# Patient Record
Sex: Male | Born: 1935 | Race: White | Hispanic: No | Marital: Single | Smoking: Current every day smoker
Health system: Southern US, Community
[De-identification: ages and names within clinical notes are randomized; demographics above are authoritative.]

## PROBLEM LIST (undated history)

## (undated) DIAGNOSIS — G20 Parkinson's disease: Secondary | ICD-10-CM

## (undated) DIAGNOSIS — F1010 Alcohol abuse, uncomplicated: Secondary | ICD-10-CM

## (undated) DIAGNOSIS — F319 Bipolar disorder, unspecified: Secondary | ICD-10-CM

## (undated) DIAGNOSIS — J449 Chronic obstructive pulmonary disease, unspecified: Secondary | ICD-10-CM

## (undated) DIAGNOSIS — G20A1 Parkinson's disease without dyskinesia, without mention of fluctuations: Secondary | ICD-10-CM

---

## 2015-05-18 ENCOUNTER — Encounter: Payer: Self-pay | Admitting: Emergency Medicine

## 2015-05-18 ENCOUNTER — Emergency Department: Payer: MEDICARE

## 2015-05-18 ENCOUNTER — Emergency Department
Admission: EM | Admit: 2015-05-18 | Discharge: 2015-05-18 | Disposition: A | Discharge status: Home / Self Care | Payer: MEDICARE | Attending: Emergency Medicine | Admitting: Emergency Medicine

## 2015-05-18 DIAGNOSIS — J441 Chronic obstructive pulmonary disease with (acute) exacerbation: Secondary | ICD-10-CM | POA: Insufficient documentation

## 2015-05-18 DIAGNOSIS — R0602 Shortness of breath: Secondary | ICD-10-CM | POA: Diagnosis present

## 2015-05-18 DIAGNOSIS — Z720 Tobacco use: Secondary | ICD-10-CM | POA: Insufficient documentation

## 2015-05-18 DIAGNOSIS — Z79899 Other long term (current) drug therapy: Secondary | ICD-10-CM | POA: Diagnosis not present

## 2015-05-18 HISTORY — DX: Parkinson's disease without dyskinesia, without mention of fluctuations: G20.A1

## 2015-05-18 HISTORY — DX: Chronic obstructive pulmonary disease, unspecified: J44.9

## 2015-05-18 HISTORY — DX: Parkinson's disease: G20

## 2015-05-18 HISTORY — DX: Alcohol abuse, uncomplicated: F10.10

## 2015-05-18 HISTORY — DX: Bipolar disorder, unspecified: F31.9

## 2015-05-18 LAB — CBC
HCT: 35.4 % — ABNORMAL LOW (ref 40.0–52.0)
Hemoglobin: 12 g/dL — ABNORMAL LOW (ref 13.0–18.0)
MCH: 30.2 pg (ref 26.0–34.0)
MCHC: 33.8 g/dL (ref 32.0–36.0)
MCV: 89.3 fL (ref 80.0–100.0)
PLATELETS: 187 10*3/uL (ref 150–440)
RBC: 3.96 MIL/uL — ABNORMAL LOW (ref 4.40–5.90)
RDW: 15.1 % — ABNORMAL HIGH (ref 11.5–14.5)
WBC: 7.2 10*3/uL (ref 3.8–10.6)

## 2015-05-18 LAB — COMPREHENSIVE METABOLIC PANEL
ALT: 7 U/L — AB (ref 17–63)
ANION GAP: 9 (ref 5–15)
AST: 16 U/L (ref 15–41)
Albumin: 3.3 g/dL — ABNORMAL LOW (ref 3.5–5.0)
Alkaline Phosphatase: 57 U/L (ref 38–126)
BUN: 11 mg/dL (ref 6–20)
CALCIUM: 8.4 mg/dL — AB (ref 8.9–10.3)
CHLORIDE: 106 mmol/L (ref 101–111)
CO2: 25 mmol/L (ref 22–32)
Creatinine, Ser: 0.8 mg/dL (ref 0.61–1.24)
GFR calc Af Amer: >60 mL/min (ref >60)
GFR calc non Af Amer: >60 mL/min (ref >60)
Glucose, Bld: 90 mg/dL (ref 65–99)
POTASSIUM: 2.9 mmol/L — AB (ref 3.5–5.1)
Sodium: 140 mmol/L (ref 135–145)
TOTAL PROTEIN: 6 g/dL — AB (ref 6.5–8.1)
Total Bilirubin: 0.5 mg/dL (ref 0.3–1.2)

## 2015-05-18 LAB — TROPONIN I

## 2015-05-18 MED ORDER — IPRATROPIUM-ALBUTEROL 0.5-2.5 (3) MG/3ML IN SOLN
3.0000 mL | Freq: Once | RESPIRATORY_TRACT | Status: AC
  Administered 2015-05-18: 3 mL via RESPIRATORY_TRACT
  Filled 2015-05-18: qty 3

## 2015-05-18 MED ORDER — METHYLPREDNISOLONE SODIUM SUCC 125 MG IJ SOLR
125.0000 mg | Freq: Once | INTRAMUSCULAR | Status: AC
  Administered 2015-05-18: 125 mg via INTRAVENOUS
  Filled 2015-05-18: qty 2

## 2015-05-18 MED ORDER — ALBUTEROL SULFATE (2.5 MG/3ML) 0.083% IN NEBU
2.5000 mg | INHALATION_SOLUTION | Freq: Once | RESPIRATORY_TRACT | Status: AC
  Administered 2015-05-18: 2.5 mg via RESPIRATORY_TRACT
  Filled 2015-05-18: qty 3

## 2015-05-18 MED ORDER — PREDNISONE 50 MG PO TABS: 50.0000 mg | ORAL_TABLET | Freq: Every day | ORAL | Status: AC

## 2015-05-18 MED ORDER — ALBUTEROL SULFATE HFA 108 (90 BASE) MCG/ACT IN AERS
2.0000 | INHALATION_SPRAY | Freq: Four times a day (QID) | RESPIRATORY_TRACT | Status: AC | PRN
Start: 2015-05-18 — End: ?

## 2015-05-18 MED ORDER — POTASSIUM CHLORIDE CRYS ER 20 MEQ PO TBCR
20.0000 meq | EXTENDED_RELEASE_TABLET | Freq: Once | ORAL | Status: AC
  Administered 2015-05-18: 20 meq via ORAL
  Filled 2015-05-18: qty 1

## 2015-05-18 NOTE — ED Notes (Signed)
PT with d/c orders.  No family present, pt unable to give name or phone number of contact person.  Will attempt to find contact information for family to discharge pt.

## 2015-05-18 NOTE — Discharge Instructions (Signed)

## 2015-05-18 NOTE — ED Provider Notes (Signed)
[facility] Emergency Department Provider Note  ____________________________________________  Time seen: On arrival, via EMS  I have reviewed the triage vital signs and the nursing notes.   HISTORY  Chief Complaint Shortness of Breath  History is significantly limited as patient has difficulty providing answers  HPI Adrian Fisher is a 81 y.o. male who presents with reported shortness of breath and feeling "sick". Again history is somewhat limited the patient reports he feels "wiped out". He denies chest pain. He apparently has a history of bipolar disorder and Parkinson's disease as well as alcohol abuse and COPD. He reports he continues to smoke. He does use oxygen at home as needed     Past Medical History  Diagnosis Date  . COPD (chronic obstructive pulmonary disease)   . ETOH abuse   . Bipolar 1 disorder   . Parkinson disease     There are no active problems to display for this patient.   History reviewed. No pertinent past surgical history.  Current Outpatient Rx  Name  Route  Sig  Dispense  Refill  . carbidopa-levodopa-entacapone (STALEVO) 50-200-200 MG per tablet   Oral   Take 1 tablet by mouth 3 (three) times daily.         Marland Kitchen donepezil (ARICEPT) 5 MG tablet   Oral   Take 5 mg by mouth at bedtime.         . pantoprazole (PROTONIX) 40 MG tablet   Oral   Take 40 mg by mouth daily.         . pramipexole (MIRAPEX) 0.125 MG tablet   Oral   Take 0.125 mg by mouth 3 (three) times daily.           Allergies Review of patient's allergies indicates no known allergies.  History reviewed. No pertinent family history.  Social History History  Substance Use Topics  . Smoking status: Current Every Day Smoker  . Smokeless tobacco: Not on file  . Alcohol Use: Yes    Review of Systems  Constitutional: Negative for fever. Eyes: Patient reports very poor eyesight and states he is going to have cataract surgery ENT:  Negative for sore throat Cardiovascular: Negative for chest pain. Respiratory: Positive for shortness of breath Gastrointestinal: Negative for abdominal pain, vomiting and diarrhea. Genitourinary: Negative for dysuria. Musculoskeletal: Negative for back pain. Skin: Negative for rash. Neurological: Negative for headaches or focal weakness Psychiatric: No anxiety    ____________________________________________   PHYSICAL EXAM:  VITAL SIGNS: ED Triage Vitals  Enc Vitals Group     BP 05/18/15 1327 139/91 mmHg     Pulse Rate 05/18/15 1327 71     Resp 05/18/15 1327 18     Temp 05/18/15 1327 97.6 F (36.4 C)     Temp src --      SpO2 05/18/15 1327 97 %     Weight 05/18/15 1327 140 lb (63.504 kg)     Height 05/18/15 1327 6\' 2"  (1.88 m)     Head Cir --      Peak Flow --      Pain Score 05/18/15 1328 0     Pain Loc --      Pain Edu? --      Excl. in GC? --      Constitutional: Patient answers questions when asked although when he arrived eyes were closed Eyes: Conjunctivae are normal.  ENT   Head: Normocephalic and atraumatic.   Mouth/Throat: Mucous membranes are moist. Cardiovascular: Normal rate, regular rhythm.  Normal and symmetric distal pulses are present in all extremities. No murmurs, rubs, or gallops. Respiratory: Normal respiratory effort without tachypnea nor retractions. Breath sounds with scattered wheezes bilaterally Gastrointestinal: Soft and non-tender in all quadrants. No distention. There is no CVA tenderness. Genitourinary: deferred Musculoskeletal: Nontender with normal range of motion in all extremities. No lower extremity tenderness nor edema. Neurologic:  Normal speech and language. No gross focal neurologic deficits are appreciated. Skin:  Skin is warm, dry and intact. No rash noted. Psychiatric: Mood and affect are normal.  ____________________________________________    LABS (pertinent positives/negatives)  Labs Reviewed  CBC - Abnormal;  Notable for the following:    RBC 3.96 (*)    Hemoglobin 12.0 (*)    HCT 35.4 (*)    RDW 15.1 (*)    All other components within normal limits  COMPREHENSIVE METABOLIC PANEL  TROPONIN I    ____________________________________________   EKG  None  ____________________________________________    RADIOLOGY I have personally reviewed any xrays that were ordered on this patient: X-ray show changes of COPD but otherwise unremarkable  ____________________________________________   PROCEDURES  Procedure(s) performed: none  Critical Care performed: none  ____________________________________________   INITIAL IMPRESSION / ASSESSMENT AND PLAN / ED COURSE  Pertinent labs & imaging results that were available during my care of the patient were reviewed by me and considered in my medical decision making (see chart for details).  Patient's exam is consistent with COPD exacerbation. He denies chest pain. We will treat with Solu-Medrol and nebulizers and reevaluate.   ----------------------------------------- 3:20 PM on 05/18/2015 -----------------------------------------  Chest x-ray unremarkable. Patient has been treated with steroid's and nebs and his breathing seems to be much improved. I do not see an indication to admit him to the hospital at this time even vitals that are essentially normal and significant improvement in his breathing. He states that he will follow-up with his eye doctor and his PCP. He knows he can return at any time ____________________________________________   FINAL CLINICAL IMPRESSION(S) / ED DIAGNOSES  Final diagnoses:  COPD exacerbation     Jene Every, MD 05/18/15 [phone]

## 2015-05-18 NOTE — ED Notes (Signed)
Pt to ER via EMS from home.  Reports of male in house that he has been short of breath since last night.  Pt alert, but slow to answer questions on arrival.  NAD noted at this time, Dr. Cyril Loosen to bedside to assess pt.

## 2015-05-27 ENCOUNTER — Emergency Department: Payer: MEDICARE

## 2015-05-27 ENCOUNTER — Other Ambulatory Visit: Payer: Self-pay

## 2015-05-27 ENCOUNTER — Emergency Department
Admission: EM | Admit: 2015-05-27 | Discharge: 2015-05-27 | Disposition: A | Discharge status: Home / Self Care | Payer: MEDICARE | Attending: Emergency Medicine | Admitting: Emergency Medicine

## 2015-05-27 ENCOUNTER — Encounter: Payer: Self-pay | Admitting: Emergency Medicine

## 2015-05-27 DIAGNOSIS — F0280 Dementia in other diseases classified elsewhere without behavioral disturbance: Secondary | ICD-10-CM | POA: Diagnosis not present

## 2015-05-27 DIAGNOSIS — Z79811 Long term (current) use of aromatase inhibitors: Secondary | ICD-10-CM | POA: Diagnosis not present

## 2015-05-27 DIAGNOSIS — G3183 Dementia with Lewy bodies: Secondary | ICD-10-CM | POA: Diagnosis not present

## 2015-05-27 DIAGNOSIS — Z791 Long term (current) use of non-steroidal anti-inflammatories (NSAID): Secondary | ICD-10-CM | POA: Insufficient documentation

## 2015-05-27 DIAGNOSIS — Z79899 Other long term (current) drug therapy: Secondary | ICD-10-CM | POA: Insufficient documentation

## 2015-05-27 DIAGNOSIS — J471 Bronchiectasis with (acute) exacerbation: Secondary | ICD-10-CM

## 2015-05-27 DIAGNOSIS — R000 Tachycardia, unspecified: Secondary | ICD-10-CM | POA: Insufficient documentation

## 2015-05-27 DIAGNOSIS — Z720 Tobacco use: Secondary | ICD-10-CM | POA: Insufficient documentation

## 2015-05-27 DIAGNOSIS — R0789 Other chest pain: Secondary | ICD-10-CM | POA: Diagnosis present

## 2015-05-27 LAB — CBC WITH DIFFERENTIAL/PLATELET
Basophils Absolute: 0 10*3/uL (ref 0–0.1)
Basophils Relative: 1 %
Eosinophils Absolute: 0.2 10*3/uL (ref 0–0.7)
HEMATOCRIT: 37.9 % — AB (ref 40.0–52.0)
Hemoglobin: 12.4 g/dL — ABNORMAL LOW (ref 13.0–18.0)
Lymphs Abs: 1.4 10*3/uL (ref 1.0–3.6)
MCH: 29.6 pg (ref 26.0–34.0)
MCHC: 32.7 g/dL (ref 32.0–36.0)
MCV: 90.6 fL (ref 80.0–100.0)
Monocytes Absolute: 0.7 10*3/uL (ref 0.2–1.0)
Monocytes Relative: 10 %
NEUTROS ABS: 5 10*3/uL (ref 1.4–6.5)
Neutrophils Relative %: 67 %
PLATELETS: 210 10*3/uL (ref 150–440)
RBC: 4.18 MIL/uL — AB (ref 4.40–5.90)
RDW: 15.3 % — AB (ref 11.5–14.5)
WBC: 7.4 10*3/uL (ref 3.8–10.6)

## 2015-05-27 LAB — BASIC METABOLIC PANEL
ANION GAP: 7 (ref 5–15)
BUN: 17 mg/dL (ref 6–20)
CALCIUM: 8.9 mg/dL (ref 8.9–10.3)
CO2: 27 mmol/L (ref 22–32)
Chloride: 111 mmol/L (ref 101–111)
Creatinine, Ser: 0.79 mg/dL (ref 0.61–1.24)
GFR calc Af Amer: >60 mL/min (ref >60)
GLUCOSE: 91 mg/dL (ref 65–99)
Potassium: 3.6 mmol/L (ref 3.5–5.1)
Sodium: 145 mmol/L (ref 135–145)

## 2015-05-27 LAB — LACTIC ACID, PLASMA: LACTIC ACID, VENOUS: 1.2 mmol/L (ref 0.5–2.0)

## 2015-05-27 LAB — TROPONIN I: Troponin I: <0.03 ng/mL (ref <0.031)

## 2015-05-27 MED ORDER — IOHEXOL 350 MG/ML SOLN
75.0000 mL | Freq: Once | INTRAVENOUS | Status: AC | PRN
  Administered 2015-05-27: 75 mL via INTRAVENOUS

## 2015-05-27 MED ORDER — LEVOFLOXACIN 500 MG PO TABS: 500.0000 mg | ORAL_TABLET | Freq: Every day | ORAL | Status: DC

## 2015-05-27 MED ORDER — LEVOFLOXACIN IN D5W 500 MG/100ML IV SOLN
500.0000 mg | Freq: Once | INTRAVENOUS | Status: AC
  Administered 2015-05-27: 500 mg via INTRAVENOUS
  Filled 2015-05-27 (×2): qty 100

## 2015-05-27 MED ORDER — IPRATROPIUM-ALBUTEROL 0.5-2.5 (3) MG/3ML IN SOLN
3.0000 mL | Freq: Once | RESPIRATORY_TRACT | Status: AC
  Administered 2015-05-27: 3 mL via RESPIRATORY_TRACT
  Filled 2015-05-27: qty 3

## 2015-05-27 NOTE — ED Notes (Signed)
Pt's wife unable to be reached in order to be d/c. Will try again momentarily.
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
## 2015-05-27 NOTE — ED Notes (Signed)
Pt returns to ed

## 2015-05-27 NOTE — ED Notes (Signed)
Pt provided with a meal tray.

## 2015-05-27 NOTE — ED Notes (Signed)
This RN called pt's wife again in order to get pt picked up. Wife stated to this RN "I am on my way right now"

## 2015-05-27 NOTE — ED Notes (Signed)
Pharmacy called about pts Levaquin. Pharmacy reports they will send it to ED.

## 2015-05-27 NOTE — ED Notes (Signed)
Patient transported to X-ray 

## 2015-05-27 NOTE — ED Notes (Addendum)
This RN attempted to contact wife, was successful. Wife states must find and get a ride in order to come and pick pt up. Will call back once she has a ride. Charge RN made aware, verbalized okay to keep pt in room at this time.
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
## 2015-05-27 NOTE — Discharge Instructions (Signed)
Take levaquin daily for 7 days.   Follow up with your doctor.   Social work will contact you.   Return to ER if you have worse chest pain, trouble breathing, vomiting, fever.
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
## 2015-05-27 NOTE — ED Provider Notes (Signed)
Troponin is negative 2, patient will continue outpatient follow-up with his primary care doctor  Emily Filbert, MD 05/27/15 [phone]

## 2015-05-27 NOTE — ED Notes (Signed)
ABX infusing.  Pt to be dc'd following completion.  Pt made aware.

## 2015-05-27 NOTE — ED Notes (Signed)
Pt to ed via ems from home wife reports breathing difficulty and chest pressure for six weeks. Pt alert/x1 on arrival to ed. Ems reports bp 147/89, cbg 77, 02-98% ra. Pt denies any pain on arrival to ed.   Per ekg heart rate is irregular. Pt with hx of parkinsons.

## 2015-05-28 ENCOUNTER — Emergency Department
Admission: EM | Admit: 2015-05-28 | Discharge: 2015-05-31 | Disposition: A | Discharge status: Home / Self Care | Payer: MEDICARE | Attending: Emergency Medicine | Admitting: Emergency Medicine

## 2015-05-28 DIAGNOSIS — R0602 Shortness of breath: Secondary | ICD-10-CM

## 2015-05-28 DIAGNOSIS — G20 Parkinson's disease: Secondary | ICD-10-CM | POA: Diagnosis not present

## 2015-05-28 DIAGNOSIS — Z0471 Encounter for examination and observation following alleged adult physical abuse: Secondary | ICD-10-CM | POA: Diagnosis present

## 2015-05-28 DIAGNOSIS — J441 Chronic obstructive pulmonary disease with (acute) exacerbation: Secondary | ICD-10-CM | POA: Insufficient documentation

## 2015-05-28 DIAGNOSIS — R531 Weakness: Secondary | ICD-10-CM | POA: Diagnosis not present

## 2015-05-28 DIAGNOSIS — Z720 Tobacco use: Secondary | ICD-10-CM | POA: Diagnosis not present

## 2015-05-28 DIAGNOSIS — Z79899 Other long term (current) drug therapy: Secondary | ICD-10-CM | POA: Diagnosis not present
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
## 2015-05-29 MED ORDER — TUBERCULIN PPD 5 UNIT/0.1ML ID SOLN
5.0000 [IU] | Freq: Once | INTRADERMAL | Status: AC
  Administered 2015-05-29: 5 [IU] via INTRADERMAL
  Filled 2015-05-29 (×2): qty 0.1

## 2015-05-29 MED ORDER — PRAMIPEXOLE DIHYDROCHLORIDE 0.25 MG PO TABS
0.1250 mg | ORAL_TABLET | Freq: Three times a day (TID) | ORAL | Status: DC
  Administered 2015-05-29 – 2015-05-31 (×9): 0.125 mg via ORAL
  Filled 2015-05-29 (×2): qty 0.5
  Filled 2015-05-29: qty 1
  Filled 2015-05-29 (×7): qty 0.5
  Filled 2015-05-29: qty 1
  Filled 2015-05-29 (×3): qty 0.5
  Filled 2015-05-29: qty 1
  Filled 2015-05-29: qty 0.5

## 2015-05-29 MED ORDER — CARBIDOPA-LEVODOPA ER 50-200 MG PO TBCR
1.0000 | EXTENDED_RELEASE_TABLET | Freq: Two times a day (BID) | ORAL | Status: DC
  Administered 2015-05-29 – 2015-05-31 (×6): 1 via ORAL
  Filled 2015-05-29 (×10): qty 1

## 2015-05-29 MED ORDER — LEVOFLOXACIN 500 MG PO TABS
500.0000 mg | ORAL_TABLET | Freq: Every day | ORAL | Status: DC
  Administered 2015-05-29 – 2015-05-31 (×3): 500 mg via ORAL
  Filled 2015-05-29 (×3): qty 1

## 2015-05-29 MED ORDER — DONEPEZIL HCL 5 MG PO TABS
5.0000 mg | ORAL_TABLET | Freq: Every day | ORAL | Status: DC
  Administered 2015-05-29 – 2015-05-31 (×3): 5 mg via ORAL
  Filled 2015-05-29 (×4): qty 1

## 2015-05-29 MED ORDER — PANTOPRAZOLE SODIUM 40 MG PO TBEC
40.0000 mg | DELAYED_RELEASE_TABLET | Freq: Every day | ORAL | Status: DC
  Administered 2015-05-29 – 2015-05-31 (×3): 40 mg via ORAL
  Filled 2015-05-29 (×3): qty 1

## 2015-05-29 NOTE — ED Notes (Signed)
Pt ate 100% of breakfast tray.
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
## 2015-05-29 NOTE — ED Notes (Signed)
Pt cleaned and diaper changed
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
## 2015-05-29 NOTE — ED Notes (Signed)
Pt cleaning up pericare done and clothes changes pt is one person assist with encoragement and direction in bed.
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
## 2015-05-29 NOTE — ED Notes (Signed)
Pt ate 100% of lunch tray, PT at bedside to work with pt

## 2015-05-29 NOTE — ED Notes (Signed)
Pharmacy called, talked to [facility], to send Mirapex. States will send.

## 2015-05-29 NOTE — Progress Notes (Signed)
LCSW called Nurse ERICA back and the number provided by nurse was not correct. Unable to reach this person.

## 2015-05-29 NOTE — Progress Notes (Signed)
LCSW met with patient and collected data to for assessment. LCSW spoke to Dr Edd Fabian and requested TB test, PT exam LCSW will complete FL2, and await consults to find suitable placement.

## 2015-05-29 NOTE — ED Notes (Signed)
Pt resting in bed quietly, eyes closed, no distress

## 2015-05-29 NOTE — ED Notes (Addendum)
Pt given juice and coffee, pt awake and alert, breakfast tray ordered

## 2015-05-29 NOTE — ED Notes (Signed)
Pt switched to hospital bed

## 2015-05-29 NOTE — ED Notes (Signed)
Bedside report given to Kimrey, RN 

## 2015-05-30 NOTE — ED Notes (Signed)
BEHAVIORAL HEALTH ROUNDING Patient sleeping: Yes.   Patient alert and oriented: not applicable Behavior appropriate: Yes.   Nutrition and fluids offered: Yes  Toileting and hygiene offered: Yes  Sitter present: yes Law enforcement present: Yes  

## 2015-05-30 NOTE — ED Notes (Signed)
BEHAVIORAL HEALTH ROUNDING Patient sleeping: No. Patient alert and oriented: yes Behavior appropriate: Yes.  ; If no, describe:  Nutrition and fluids offered: Yes  Toileting and hygiene offered: Yes  Sitter present: no Law enforcement present: Yes  

## 2015-05-30 NOTE — ED Notes (Signed)
Pt ate 85% of breakfast meal this AM.

## 2015-05-30 NOTE — ED Notes (Signed)
Pt had peri care done changed briefs medium episodes of urine. Pt is one person assist with guidance. 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

## 2015-05-30 NOTE — ED Notes (Signed)
Pt diaper changed via ED tech.

## 2015-05-30 NOTE — ED Notes (Signed)
BEHAVIORAL HEALTH ROUNDING Patient sleeping: No. Patient alert and oriented: yes Behavior appropriate: Yes.  ;  Nutrition and fluids offered: Yes  Toileting and hygiene offered: Yes  Sitter present: yes Law enforcement present: Yes  

## 2015-05-30 NOTE — ED Notes (Signed)

## 2015-05-30 NOTE — ED Notes (Signed)
Pt moved to rm 22 due to pt has incontinence and not able to care pt appropriately in the hallway.

## 2015-05-31 MED ORDER — LEVOFLOXACIN 500 MG PO TABS: 500.0000 mg | ORAL_TABLET | Freq: Every day | ORAL | Status: AC

## 2015-05-31 NOTE — ED Notes (Signed)
BEHAVIORAL HEALTH ROUNDING Patient sleeping: No. Patient alert and oriented: alert; not oriented Behavior appropriate: Yes.  ; If no, describe:  Nutrition and fluids offered: Yes  Toileting and hygiene offered: Yes  Sitter present: not applicable Law enforcement present: Yes  

## 2015-05-31 NOTE — ED Provider Notes (Signed)
-----------------------------------------   9:43 PM on 05/31/2015 -----------------------------------------   Blood pressure 114/76, pulse 100, temperature 97.5 F (36.4 C), temperature source Oral, resp. rate 18, weight 139 lb 1.8 oz (63.1 kg), SpO2 98 %.  The patient's sister in law is here to pick him up.  His nurse pointed out that he is on Levaquin.  I reviewed his record and see that he was scheduled for 10 days of treatment from August 16 through August 26.  His sister-in-law does not think that they have the medications currently.  I have given her a prescription for another 6 doses so that he can complete his course.  She knows to not let him take doses from both bottles.  Loleta Rose, MD 05/31/15 2144

## 2015-05-31 NOTE — ED Notes (Addendum)
BEHAVIORAL HEALTH ROUNDING Patient sleeping: No. Patient alert and oriented: yes Behavior appropriate: Yes.  ; If no, describe:  Nutrition and fluids offered: Yes  Toileting and hygiene offered: Yes  Sitter present: no Law enforcement present: Yes  

## 2015-05-31 NOTE — ED Provider Notes (Signed)
-----------------------------------------   6:40 AM on 05/31/2015 -----------------------------------------  There were no acute events overnight. Patient resting in no acute distress. Disposition per social work.  Irean Hong, MD 05/31/15 [phone]

## 2015-05-31 NOTE — ED Notes (Signed)
Lunch served

## 2015-05-31 NOTE — ED Notes (Signed)
BEHAVIORAL HEALTH ROUNDING Patient sleeping: Yes Patient alert and oriented: not applicable Behavior appropriate: Yes.  ; If no, describe:  Nutrition and fluids offered: Yes  Toileting and hygiene offered: No Sitter present: not applicable Law enforcement present: Yes  

## 2015-05-31 NOTE — ED Notes (Signed)
BEHAVIORAL HEALTH ROUNDING  Patient sleeping: Yes.  Patient alert and oriented: not applicable  Behavior appropriate: Yes.  Nutrition and fluids offered: Yes  Toileting and hygiene offered: Yes  Sitter present: yes  Patent examiner present: Rosita Fire

## 2015-05-31 NOTE — ED Notes (Signed)
Patient assisted to wheelchair with minimal assistance from staff.  This activity done in preparation of discharge later tonight.

## 2015-05-31 NOTE — ED Notes (Signed)
Dinner served

## 2015-05-31 NOTE — ED Notes (Signed)
BEHAVIORAL HEALTH ROUNDING Patient sleeping: Yes.   Patient alert and oriented: not applicable Behavior appropriate: Yes.  ; If no, describe:  Nutrition and fluids offered: No Toileting and hygiene offered: No Sitter present: not applicable Law enforcement present: Yes  

## 2015-05-31 NOTE — ED Provider Notes (Signed)
-----------------------------------------   3:02 PM on 05/31/2015 -----------------------------------------  Stanton Kidney (Child psychotherapist) has been working very closely with the patient, and has arranged for the patient to be discharged to the patient's sister-in-law's home. The patient has stayed with his sister-in-law before. The patient feels comfortable/safe living with the sister-in-law. I have written a prescription for a hospital bed for the patient's home. The social worker has faxed papers for home physical therapy and home health. Patient will be discharged home in the sister-in-law's care.  Minna Antis, MD 05/31/15 [phone]

## 2015-05-31 NOTE — ED Notes (Signed)
Per SW, plan for pt is progressing.  Sister-in-law is willing to care for pt and pt is willing to go to sister's-in-law home.

## 2015-05-31 NOTE — ED Notes (Signed)
Head of pt's bed lowered.  Blankets rearranged over pt.  Pt sleeping.  Breathing even and unlabored.  NAD.

## 2015-05-31 NOTE — Progress Notes (Signed)
CSW received Rx from EDP for patient's hospital bed, Rx faxed to RN Alcario Drought at [facility].  CSW will put orig. Rx in packet for patient to take home.    Adrian Fisher. Theresia Majors, MSW Clinical Social Work Department Emergency Room [phone] 2:47 PM

## 2015-05-31 NOTE — ED Notes (Signed)
Patient assigned to appropriate care area. Patient oriented to unit/care area: Informed that, for their safety, care areas are designed for safety and monitored by security cameras at all times; and visiting hours explained to patient. Patient verbalizes understanding, and verbal contract for safety obtained. 

## 2015-05-31 NOTE — ED Notes (Signed)
BEHAVIORAL HEALTH ROUNDING Patient sleeping: No. Patient alert and oriented: alert; not oriented Behavior appropriate: Yes.  ; If no, describe:  Nutrition and fluids offered: No Toileting and hygiene offered: Yes  Sitter present: not applicable Law enforcement present: Yes

## 2015-05-31 NOTE — ED Notes (Signed)

## 2015-05-31 NOTE — ED Notes (Signed)
BEHAVIORAL HEALTH ROUNDING Patient sleeping: Yes.   Patient alert and oriented: not applicable Behavior appropriate: Yes.   Nutrition and fluids offered: Yes  Toileting and hygiene offered: Yes  Sitter present: yes Law enforcement present: Yes  

## 2015-05-31 NOTE — Progress Notes (Signed)
Original plan was for patient to discharge to SNF for rehab. Patient has no payer source for rehab. Due to no 3 night stay.    CSW called emergency contact number on patient's face sheet.  Spoke to Adrian Fisher stated she is patient's sister-in-law and lives in [facility], [place].  CSW informed unable to provide any Information on patient but would like to obtain information from her in ref. to patient.  Per Adrian Fisher patient has no immediate family as she knows he is unable to care for himself and stated the situation with her sister was unfortunate.  Adrian Fisher cared for patient a few years ago for about 6 months.  She last saw him the first of this month when she took patient's wife to the grocery store.  States patient's baseline is mostly he stays in bed, sits in a chair or his wheel chair.   Patient has been to rehab several times (recently 3 months in [place] less than a month ago) however stated patient fell 3 times while at rehab.  Patient mostly uses his wheel chair because he is unsteady due to his Parkinson.  Per Adrian Fisher she is willing to have patient come live with her as she has cared for him in the past.  CSW in to meet with patient.  He is hard of hearing but answers questions appropriately if you stand close to his ear.  When asked patient if he knows Adrian Fisher he confirmed information CSW received.  States he lived with her for a while.  Patient was unsure as to why he is in the hospital says it is due to his breathing.  CSW discussed discharge with patient, and informed him Adrian Fisher stated he could come live with her.  Patient states he would like to talk to Adrian Fisher.  CSW provided patient with Adrian Fisher's phone number for him to call.   Adrian Fisher. Adrian Fisher, MSW Clinical Social Work Department Emergency Room [phone] 10:36 AM

## 2015-05-31 NOTE — ED Notes (Signed)
Pt sleeping.  Breathing even and unlabored.  NAD. 

## 2015-05-31 NOTE — Progress Notes (Signed)
CSW met with patient, he states he is willing to go live with Helene Kelp.  Call back to [facility] she is able to come pick up patient after 9:00 pm today as it will take about an hour to drive to [place].  Helene Kelp will pick up all of patient medications from his home as well as all of his equipment.   Call to patient's home health RN Danae Chen, [phone], she will arrange for patient's hospital bed to be delivered to Teresa's home once she has the Rx.  [facility] will continue to follow patient to provide OT, PT and social work services in the home.  CSW also spoke to [place] about starting a Medicaid application for long-term care.    CSW will call APS worker Herschell Dimes [phone] with update on patient's disposition to follow up once patient is discharged from the hospital.   CSW provided EPD with an update on patient's new disposition plan.  EDP will write Rx for hospital bed.   Casimer Lanius. Latanya Presser, MSW Clinical Social Work Department Emergency Room [phone] 11:20 AM

## 2015-05-31 NOTE — ED Notes (Addendum)
Pt continues to sleep.  RN wakes pt and asks if he is OK and/or needs something.  Pt says that he doesn't need anything.

## 2015-05-31 NOTE — Discharge Instructions (Signed)
Shortness of Breath Shortness of breath means you have trouble breathing. It could also mean that you have a medical problem. You should get immediate medical care for shortness of breath. CAUSES   Not enough oxygen in the air such as with high altitudes or a smoke-filled room.  Certain lung diseases, infections, or problems.  Heart disease or conditions, such as angina or heart failure.  Low red blood cells (anemia).  Poor physical fitness, which can cause shortness of breath when you exercise.  Chest or back injuries or stiffness.  Being overweight.  Smoking.  Anxiety, which can make you feel like you are not getting enough air. DIAGNOSIS  Serious medical problems can often be found during your physical exam. Tests may also be done to determine why you are having shortness of breath. Tests may include:  Chest X-rays.  Lung function tests.  Blood tests.  An electrocardiogram (ECG).  An ambulatory electrocardiogram. An ambulatory ECG records your heartbeat patterns over a 24-hour period.  Exercise testing.  A transthoracic echocardiogram (TTE). During echocardiography, sound waves are used to evaluate how blood flows through your heart.  A transesophageal echocardiogram (TEE).  Imaging scans. Your health care provider may not be able to find a cause for your shortness of breath after your exam. In this case, it is important to have a follow-up exam with your health care provider as directed.  TREATMENT  Treatment for shortness of breath depends on the cause of your symptoms and can vary greatly. HOME CARE INSTRUCTIONS   Do not smoke. Smoking is a common cause of shortness of breath. If you smoke, ask for help to quit.  Avoid being around chemicals or things that may bother your breathing, such as paint fumes and dust.  Rest as needed. Slowly resume your usual activities.  If medicines were prescribed, take them as directed for the full length of time directed. This  includes oxygen and any inhaled medicines.  Keep all follow-up appointments as directed by your health care provider. SEEK MEDICAL CARE IF:   Your condition does not improve in the time expected.  You have a hard time doing your normal activities even with rest.  You have any new symptoms. SEEK IMMEDIATE MEDICAL CARE IF:   Your shortness of breath gets worse.  You feel light-headed, faint, or develop a cough not controlled with medicines.  You start coughing up blood.  You have pain with breathing.  You have chest pain or pain in your arms, shoulders, or abdomen.  You have a fever.  You are unable to walk up stairs or exercise the way you normally do. MAKE SURE YOU:  Understand these instructions.  Will watch your condition.  Will get help right away if you are not doing well or get worse. Document Released: 06/22/2001 Document Revised: 10/02/2013 Document Reviewed: 12/13/2011 [facility] Patient Information 2015 [place], [place]. This information is not intended to replace advice given to you by your health care provider. Make sure you discuss any questions you have with your health care provider.     Weakness Weakness is a lack of strength. It may be felt all over the body (generalized) or in one specific part of the body (focal). Some causes of weakness can be serious. You may need further medical evaluation, especially if you are elderly or you have a history of immunosuppression (such as chemotherapy or HIV), kidney disease, heart disease, or diabetes. CAUSES  Weakness can be caused by many different things, including:  Infection.  Physical exhaustion.  Internal bleeding or other blood loss that results in a lack of red blood cells (anemia).  Dehydration. This cause is more common in elderly people.  Side effects or electrolyte abnormalities from medicines, such as pain medicines or sedatives.  Emotional distress, anxiety, or depression.  Circulation problems,  especially severe peripheral arterial disease.  Heart disease, such as rapid atrial fibrillation, bradycardia, or heart failure.  Nervous system disorders, such as Guillain-Barr syndrome, multiple sclerosis, or stroke. DIAGNOSIS  To find the cause of your weakness, your caregiver will take your history and perform a physical exam. Lab tests or X-rays may also be ordered, if needed. TREATMENT  Treatment of weakness depends on the cause of your symptoms and can vary greatly. HOME CARE INSTRUCTIONS   Rest as needed.  Eat a well-balanced diet.  Try to get some exercise every day.  Only take over-the-counter or prescription medicines as directed by your caregiver. SEEK MEDICAL CARE IF:   Your weakness seems to be getting worse or spreads to other parts of your body.  You develop new aches or pains. SEEK IMMEDIATE MEDICAL CARE IF:   You cannot perform your normal daily activities, such as getting dressed and feeding yourself.  You cannot walk up and down stairs, or you feel exhausted when you do so.  You have shortness of breath or chest pain.  You have difficulty moving parts of your body.  You have weakness in only one area of the body or on only one side of the body.  You have a fever.  You have trouble speaking or swallowing.  You cannot control your bladder or bowel movements.  You have black or bloody vomit or stools. MAKE SURE YOU:  Understand these instructions.  Will watch your condition.  Will get help right away if you are not doing well or get worse. Document Released: 09/27/2005 Document Revised: 03/28/2012 Document Reviewed: 11/26/2011 [facility] Patient Information 2015 [place], [place]. This information is not intended to replace advice given to you by your health care provider. Make sure you discuss any questions you have with your health care provider.

## 2015-05-31 NOTE — ED Notes (Signed)
SW with pt

## 2015-05-31 NOTE — ED Notes (Signed)
Breakfast served; however, pt remains sleeping.  Breathing even and unlabored.  NAD.

## 2015-05-31 NOTE — ED Notes (Signed)
Patient is resting comfortably.  Breathing even and unlabored.  NAD. 

## 2015-05-31 NOTE — ED Notes (Signed)
BEHAVIORAL HEALTH ROUNDING Patient sleeping: Yes.   Patient alert and oriented: not applicable Behavior appropriate: Yes.  ; If no, describe:  Nutrition and fluids offered: Yes (to take medication) Toileting and hygiene offered: No Sitter present: yes Law enforcement present: Yes

## 2015-06-01 NOTE — Progress Notes (Signed)
Patient discharged on Sat to his sister in-laws house [facility] [phone]  at [address] [place], [place] [address].   He will continue with [facility] and was issued a Rx for a hospital bed.  Home health RN Minerva Areola [phone] has been contacted and copy of hospital bed Rx has been faxed to her.    CSW left a message with patient's APS worker Cyril Loosen [phone] today to inform her of patient's disposition so that she is able to follow up with him.   Sammuel Hines. Theresia Majors, MSW Clinical Social Work Department Emergency Room [phone] 8:43 AM

## 2016-03-05 IMAGING — CT CT ANGIO CHEST
1 of 2 series · 18 of 30 positions shown · IV contrast (APPLIED)
Comparison: Chest x-ray earlier today and also on 05/18/2015

CLINICAL DATA: Shortness of breath.  History of COPD.

EXAM:
CT ANGIOGRAPHY CHEST WITH CONTRAST
TECHNIQUE: Multidetector CT imaging of the chest was performed using the
standard protocol during bolus administration of intravenous
contrast. Multiplanar CT image reconstructions and MIPs were
obtained to evaluate the vascular anatomy.
CONTRAST:  75mL OMNIPAQUE IOHEXOL 350 MG/ML SOLN

[Series 5: pe 1.0 thins · axial · 0.77mm/px · z∈[-308,-4]mm · 18 of 342 slices shown]
[im 19/342  lung]
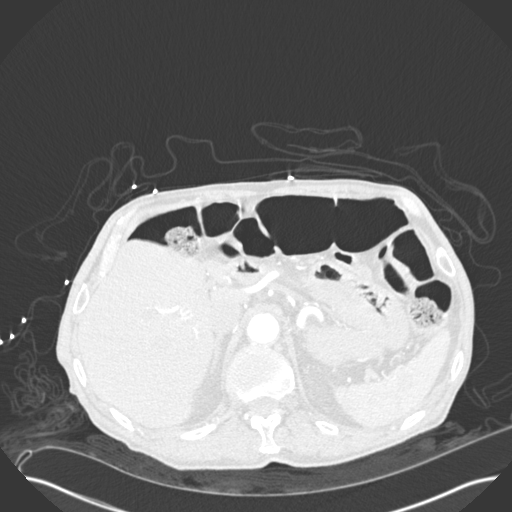
[im 38/342  mediastinal]
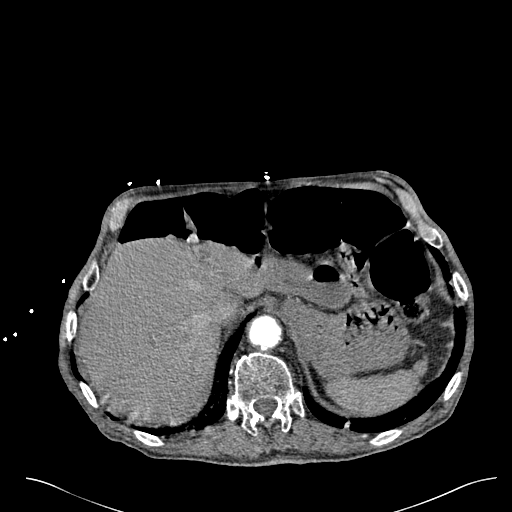
[im 57/342  lung]
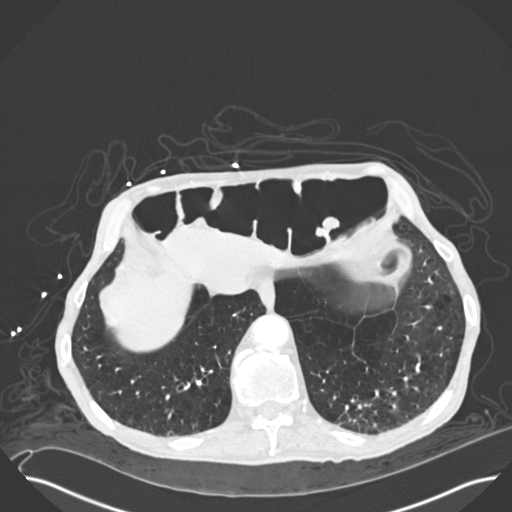
[im 76/342  mediastinal]
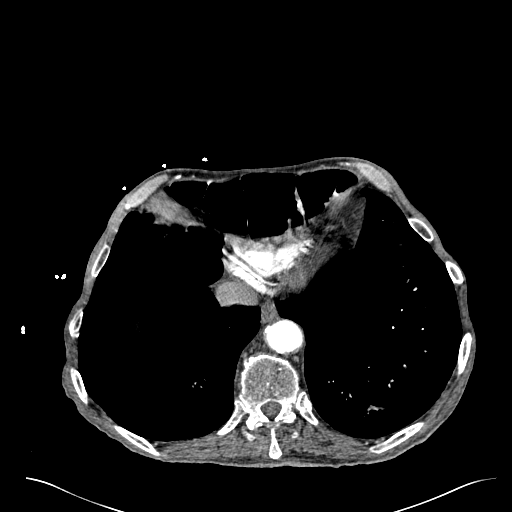
[im 95/342  lung]
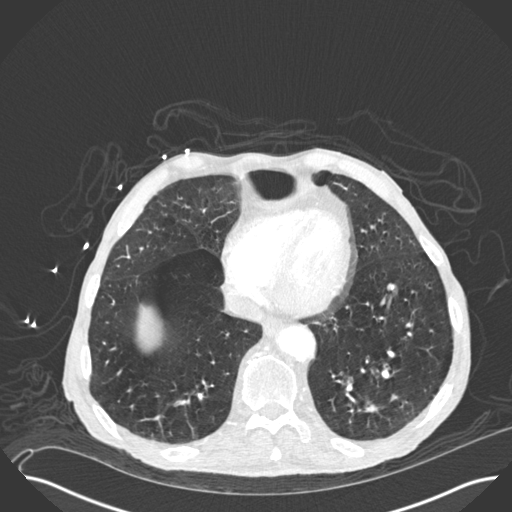
[im 114/342  mediastinal]
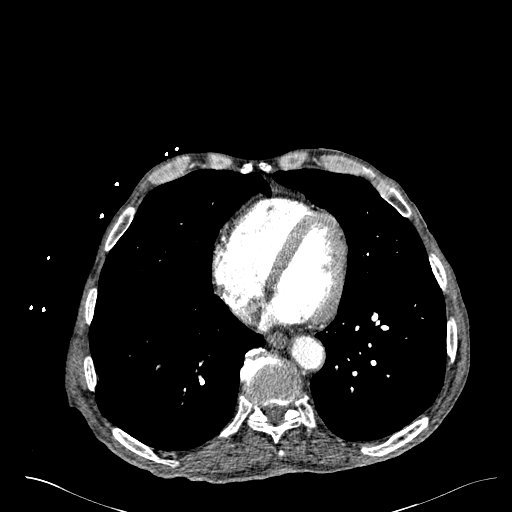
[im 133/342  lung]
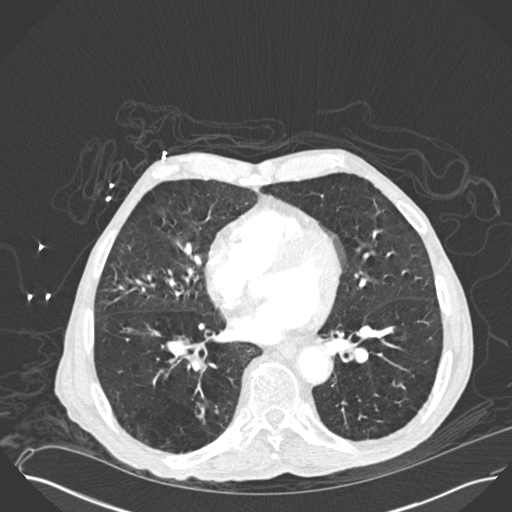
[im 152/342  mediastinal]
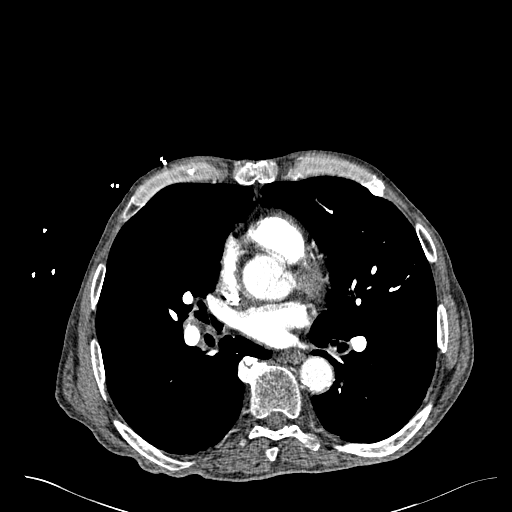
[im 165/342  lung]
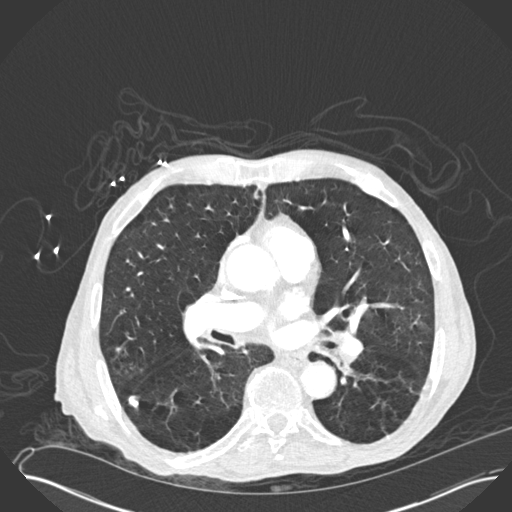
[im 171/342  mediastinal]
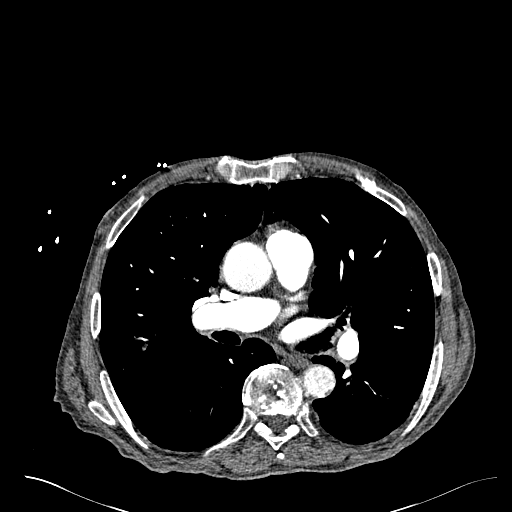
[im 190/342  lung]
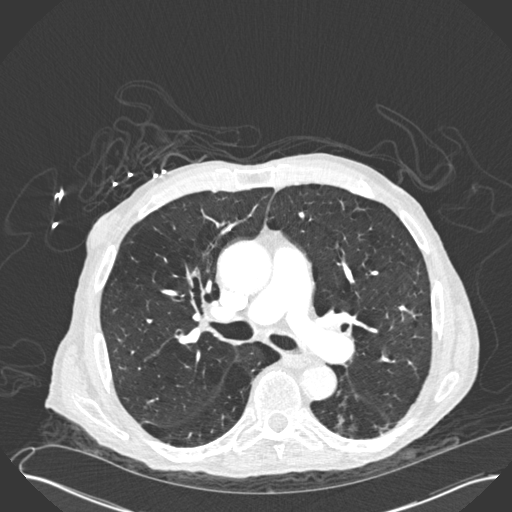
[im 209/342  mediastinal]
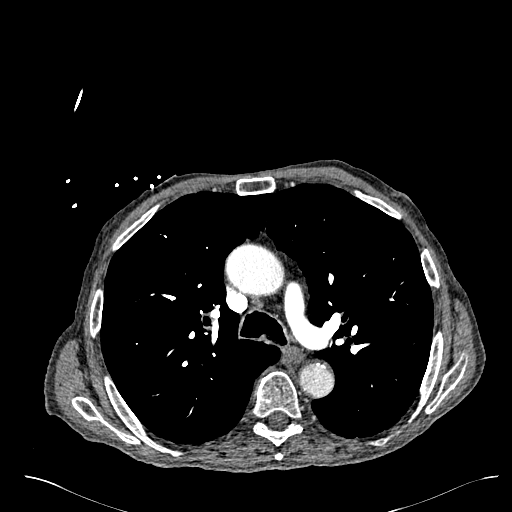
[im 228/342  lung]
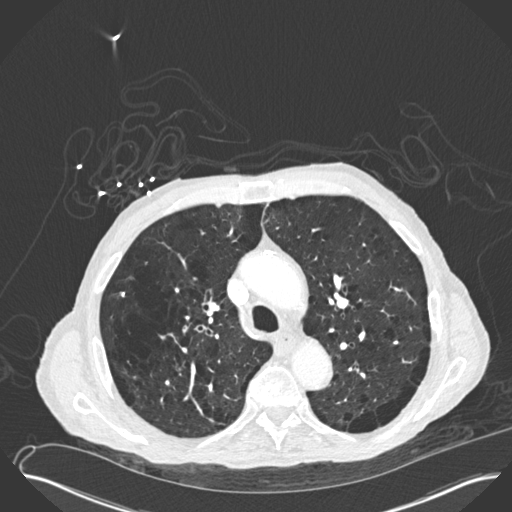
[im 247/342  mediastinal]
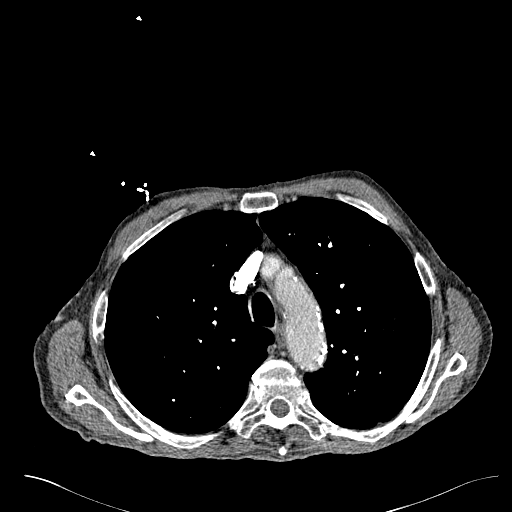
[im 266/342  lung]
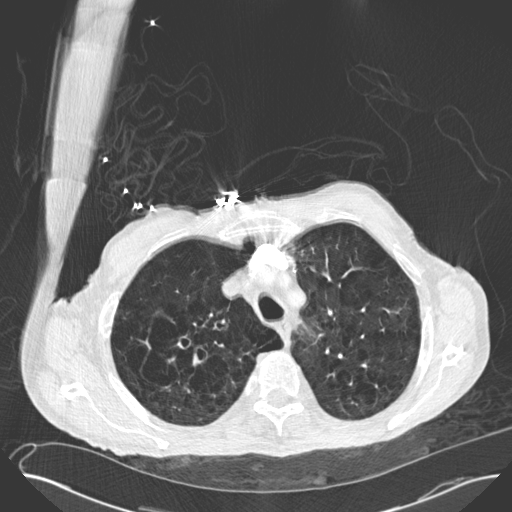
[im 285/342  mediastinal]
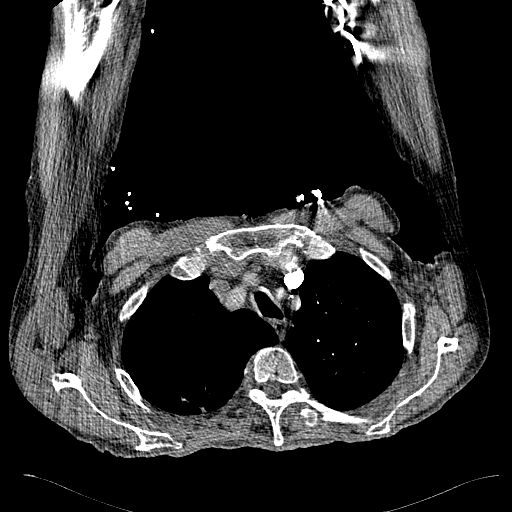
[im 304/342  lung]
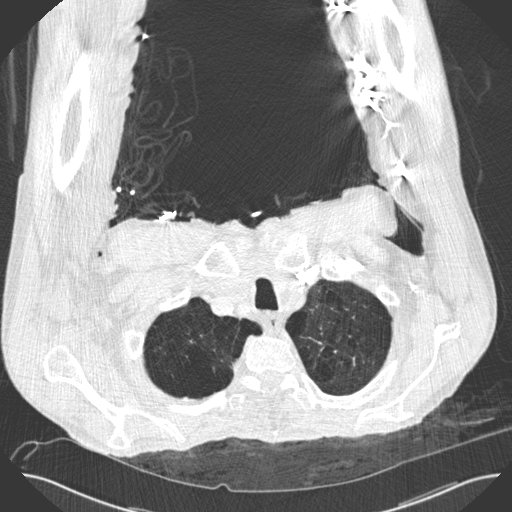
[im 323/342  mediastinal]
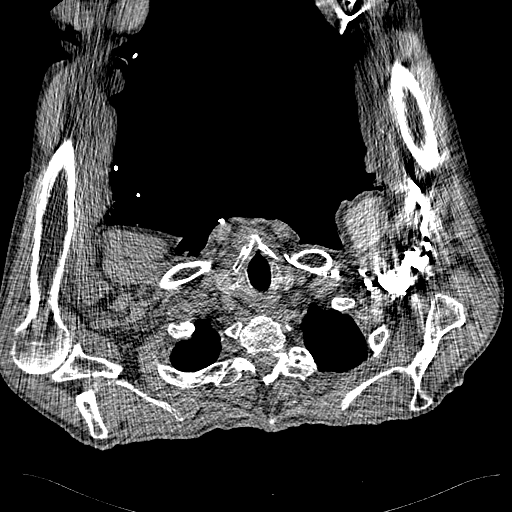

[18 of 30 positions shown; findings below may reference images not displayed]

FINDINGS: The pulmonary arteries are well opacified. There is no evidence of
pulmonary embolism. Lungs show severe emphysematous disease
bilaterally. There are areas of bronchiectasis in the right upper
lobe and both lower lobes. Bronchial thickening also present,
especially in the lower lobes bilaterally. Some mucoid material is
also visible in the left mainstem bronchus and left lower lobe
bronchi without complete airway obstruction. No focal airspace
consolidation, pneumothorax or pulmonary edema identified.

Opacity present in the posterior left lower lobe. Part of this is
somewhat linear in appearance measuring roughly 5 mm in thickness
and 15 mm in length. An adjacent more medial component is slightly
more spiculated in appearance measuring 5 x 9 mm. Based on
appearance, this may represent scar. Subtle malignancy cannot be
excluded, however. This region is not felt to be large enough to be
able to accurately characterize by PET scan and severe emphysema is
prohibitive to percutaneous lung biopsy. Recommend follow-up CT of
the chest without contrast to follow the area in 2-3 months.

There is evidence of prior granulomatous disease with a densely
calcified right lower lobe granuloma measuring 9 mm and calcified
lymph nodes in the right hilum. The heart size is normal. There is
calcified plaque in the distribution of the LAD. The thoracic aorta
is of normal caliber. Visualized upper abdominal structures are
unremarkable. The bony thorax demonstrates osteopenia and
spondylosis of the thoracic spine without evidence of fracture or
bony lesions.

Review of the MIP images confirms the above findings.
IMPRESSION: 1. No evidence of pulmonary embolism. Bronchiectasis present as well
as bronchial thickening in both lower lobes with nonobstructing
mucoid material in the left mainstem bronchus and left lower lobe
bronchi.
2. Partially nodular and partially linear appearing density in the
left lower lobe, as above. This may represent scar. Malignancy is
not excluded. Given limitations of PET scan in evaluating something
of this size, recommend initial follow-up CT without contrast to
follow the area in 2-3 months.
3. Prior granulomatous disease.
4. Coronary atherosclerosis with calcified plaque in the
distribution of the LAD.

## 2017-07-11 DEATH — deceased

## 2017-10-11 DEATH — deceased
